# Patient Record
Sex: Female | Born: 1980 | Race: Black or African American | Hispanic: No | Marital: Single | Smoking: Current every day smoker
Health system: Southern US, Community
[De-identification: ages and names within clinical notes are randomized; demographics above are authoritative.]

## PROBLEM LIST (undated history)

## (undated) HISTORY — PX: CYSTECTOMY: SUR359

## (undated) HISTORY — PX: HAND SURGERY: SHX662

## (undated) HISTORY — PX: CHOLECYSTECTOMY: SHX55

## (undated) HISTORY — PX: ANKLE SURGERY: SHX546

---

## 2003-02-02 ENCOUNTER — Other Ambulatory Visit: Admission: RE | Admit: 2003-02-02 | Discharge: 2003-02-02 | Payer: Self-pay | Admitting: Obstetrics and Gynecology

## 2003-03-10 ENCOUNTER — Encounter: Payer: Self-pay | Admitting: Obstetrics and Gynecology

## 2003-03-10 ENCOUNTER — Ambulatory Visit (HOSPITAL_COMMUNITY): Admission: RE | Admit: 2003-03-10 | Discharge: 2003-03-10 | Payer: Self-pay | Admitting: Obstetrics and Gynecology

## 2003-04-11 ENCOUNTER — Ambulatory Visit (HOSPITAL_COMMUNITY): Admission: RE | Admit: 2003-04-11 | Discharge: 2003-04-11 | Payer: Self-pay | Admitting: Obstetrics and Gynecology

## 2003-04-11 ENCOUNTER — Encounter: Payer: Self-pay | Admitting: Obstetrics and Gynecology

## 2003-07-25 ENCOUNTER — Inpatient Hospital Stay (HOSPITAL_COMMUNITY): Admission: AD | Admit: 2003-07-25 | Discharge: 2003-07-25 | Payer: Self-pay | Admitting: Obstetrics and Gynecology

## 2003-07-29 ENCOUNTER — Inpatient Hospital Stay (HOSPITAL_COMMUNITY): Admission: RE | Admit: 2003-07-29 | Discharge: 2003-08-01 | Payer: Self-pay | Admitting: Obstetrics and Gynecology

## 2003-07-29 ENCOUNTER — Encounter (INDEPENDENT_AMBULATORY_CARE_PROVIDER_SITE_OTHER): Payer: Self-pay | Admitting: *Deleted

## 2003-08-09 ENCOUNTER — Inpatient Hospital Stay (HOSPITAL_COMMUNITY): Admission: AD | Admit: 2003-08-09 | Discharge: 2003-08-09 | Payer: Self-pay | Admitting: Obstetrics and Gynecology

## 2003-08-22 ENCOUNTER — Ambulatory Visit (HOSPITAL_COMMUNITY): Admission: RE | Admit: 2003-08-22 | Discharge: 2003-08-24 | Payer: Self-pay | Admitting: General Surgery

## 2003-08-22 ENCOUNTER — Encounter (INDEPENDENT_AMBULATORY_CARE_PROVIDER_SITE_OTHER): Payer: Self-pay | Admitting: *Deleted

## 2004-10-04 ENCOUNTER — Emergency Department (HOSPITAL_COMMUNITY): Admission: EM | Admit: 2004-10-04 | Discharge: 2004-10-04 | Payer: Self-pay | Admitting: Emergency Medicine

## 2007-05-27 ENCOUNTER — Emergency Department (HOSPITAL_COMMUNITY): Admission: EM | Admit: 2007-05-27 | Discharge: 2007-05-28 | Payer: Self-pay | Admitting: Emergency Medicine

## 2007-07-15 ENCOUNTER — Emergency Department (HOSPITAL_COMMUNITY): Admission: EM | Admit: 2007-07-15 | Discharge: 2007-07-15 | Payer: Self-pay | Admitting: Emergency Medicine

## 2007-08-21 ENCOUNTER — Ambulatory Visit: Payer: Self-pay | Admitting: Family Medicine

## 2007-10-08 ENCOUNTER — Ambulatory Visit (HOSPITAL_COMMUNITY): Admission: RE | Admit: 2007-10-08 | Discharge: 2007-10-08 | Payer: Self-pay | Admitting: Family Medicine

## 2009-05-28 ENCOUNTER — Emergency Department: Payer: Self-pay | Admitting: Emergency Medicine

## 2009-06-22 ENCOUNTER — Emergency Department: Payer: Self-pay | Admitting: Unknown Physician Specialty

## 2009-11-21 ENCOUNTER — Emergency Department: Payer: Self-pay | Admitting: Emergency Medicine

## 2010-03-06 ENCOUNTER — Inpatient Hospital Stay: Payer: Self-pay | Admitting: Psychiatry

## 2010-05-16 ENCOUNTER — Emergency Department: Payer: Self-pay | Admitting: Emergency Medicine

## 2010-05-20 ENCOUNTER — Emergency Department: Payer: Self-pay | Admitting: Emergency Medicine

## 2010-05-30 ENCOUNTER — Emergency Department: Payer: Self-pay | Admitting: Emergency Medicine

## 2010-10-06 ENCOUNTER — Encounter: Payer: Self-pay | Admitting: Internal Medicine

## 2011-02-01 NOTE — Discharge Summary (Signed)
NAME:  Emma Hale, Emma Hale                        ACCOUNT NO.:  [id number]   MEDICAL RECORD NO.:  [id number]                   PATIENT TYPE:  INP   LOCATION:  9134                                 FACILITY:  [facility]   PHYSICIAN:  Hal Morales, M.D.             DATE OF BIRTH:  May 02, 1981   DATE OF ADMISSION:  07/29/2003  DATE OF DISCHARGE:  08/01/2003                                 DISCHARGE SUMMARY   ADMITTING DIAGNOSES:  1. Intrauterine pregnancy at term.  2. Desires repeat cesarean section and tubal sterilization.   DISCHARGE DIAGNOSES:  1. Intrauterine pregnancy at term.  2. Desires repeat cesarean section and tubal sterilization.   PROCEDURES:  Repeat low transverse cesarean section with bilateral tubal  ligation.   HOSPITAL COURSE:  [name] is a 30 year old gravida 2 para 1-0-0-1 with  an EDC of August 03, 2003 who was admitted on July 29, 2003 for a  scheduled repeat low transverse cesarean section and tubal sterilization.  Her pregnancy was remarkable for:  1. BV.  2. History of previous cesarean section with the desire for repeat.  3. 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   The patient was taken to the operating room where a repeat low transverse  cesarean section was performed by Dr. Jaymes Graff.  A JP drain was placed.  Findings were a viable female by the name of [name], weight 7 pounds 13 ounces,  Apgars were 9 and 9.  The patient tolerated the procedure well.  Estimated  blood loss was approximately 1000 mL.  She tolerated the procedure well and  was taken to the recovery room in good condition.  Infant was taken to the  full-term nursery in good condition.  By postoperative day #1 the patient  was doing well although she was having some issues with pain control with  oral pain medication.  On postoperative day #1 they did give her some  Dilaudid and Tylox.  Postoperative day #2 she was given morphine and  Phenergan and  then was given Toradol through the night.  JP drain had been  removed that day without difficulty.  Incision was clean, dry, and intact.  She was passing flatus and was tolerating a regular diet.  By the morning of  August 01, 2003, postoperative day #3, the patient was doing well.  She  did have some isolated incisional pain on the left-hand side at the site  where there was a little bit of eversion of the skin edge of the incision.  No infection of infection, cellulitis, or disruption of the staples was  noted.  She was up ad lib, was tolerating a reg diet.  She was on oral pain  medications with Tylox which were providing reasonable pain relief.  She was  deemed to have received the full benefit of her hospital stay and was  discharged home.  She was bottle  feeding her infant.   DISCHARGE INSTRUCTIONS:  Per [facility] handout.   DISCHARGE MEDICATIONS:  1. Motrin 600 mg p.o. q.6h. p.r.n. pain.  2.     Tylox one to two p.o. q.3-4h. p.r.n. pain.  3. Prenatal vitamin one p.o. daily.   DISCHARGE FOLLOW-UP:  Will occur in six weeks at [facility].     [name], C.N.M.                   Hal Morales, M.D.    VLL/MEDQ  D:  08/01/2003  T:  08/01/2003  Job:  [id number]

## 2011-02-01 NOTE — Op Note (Signed)
NAME:  Emma Hale, Emma Hale                        ACCOUNT NO.:  [id number]   MEDICAL RECORD NO.:  [id number]                   PATIENT TYPE:  OIB   LOCATION:  5735                                 FACILITY:  [facility]   PHYSICIAN:  Lina Sar, M.D. [facility]               DATE OF BIRTH:  [birth year]   DATE OF PROCEDURE:  08/23/2003  DATE OF DISCHARGE:                                 OPERATIVE REPORT   PROCEDURE:  Endoscopic retrograde cholangiopancreatography.   GASTROENTEROLOGIST:  Lina Sar, M.D.   INDICATIONS:  This 30 year old African-American female presented with acute  cholecystitis and biliary colic.  Her liver function tests were mildly  elevated.  She underwent laparoscopic cholecystectomy with findings of  mobile opacities in the common bile duct consistent with stones.  The common  bile duct measured about 5 to 6 mm in diameter.  She continued to have  abdominal pain through the night.  She is undergoing ERCP with possible  sphincterotomy and stone extraction.   ENDOSCOPE:  Fujinon single-channel side-viewing duodenoscope.   SEDATION:  Versed 10 mg, IV fentanyl 100 mcg IV, Glucagon 1.5 mg IV.   FINDINGS:  Fujinon single-channel side-viewing duodenoscope passed blindly  through the esophagus into the stomach and through the pyloric channel into  the duodenum.  Papilla appeared normal with small periampullary  diverticulum.  It was cannulated with visualization of the common bile duct  without difficulty.  The common bile duct measured about 6 mm in diabetes,  and there was no clear stone visualized in the common bile duct.  Guidewire  was placed into the common bile duct, and standard sphincterotomy was  carried out.  Through the sphincterotomy, an 8 mm Montez Morita balloon was  placed into the common bile duct.  Under fluoroscopic guidance, bile duct  was swept twice.  Occlusion cholangiogram was obtained  after sweeping the  common bile duct.  No extraction particles, or stones  were visualized  draining from the  common bile duct.  Post ERCP spot films were obtained of  the comon bile duct.  The patient tolerated the procedure well.   IMPRESSION:  1. Status post laparoscopic cholecystectomy.  2. Upper limits of normal common bile duct status post endoscopic     sphincterotomy.  3. No extraction stone are seen status post sweep of the bile duct with 8 mm     balloon x 2.   PLAN:  The patient will be observed today.  We will repeat her LFTs and  amylase and lipase in the morning.  We will advance her diet.                                               Lina Sar, M.D. [facility]    DB/MEDQ  D:  08/23/2003  T:  08/23/2003  Job:  [id number]   cc:   Leonie Man, M.D.  [address]  [place]  [place] [address]  Fax: [phone]

## 2011-02-01 NOTE — Op Note (Signed)
NAME:  Emma Hale, Emma Hale                        ACCOUNT NO.:  [id number]   MEDICAL RECORD NO.:  [id number]                   PATIENT TYPE:  OIB   LOCATION:  5735                                 FACILITY:  [facility]   PHYSICIAN:  Leonie Man, M.D.                DATE OF BIRTH:  1981/04/24   DATE OF PROCEDURE:  08/22/2003  DATE OF DISCHARGE:                                 OPERATIVE REPORT   PREOPERATIVE DIAGNOSIS:  Chronic calculus cholecystitis.   POSTOPERATIVE DIAGNOSIS:  Chronic  calculus cholecystitis with  choledocholithiasis.   PROCEDURE:  Laparoscopic cholecystectomy, and intraoperative cholangiogram.   Dictation ends here.                                               Leonie Man, M.D.    PB/MEDQ  D:  08/22/2003  T:  08/23/2003  Job:  [id number]

## 2011-02-01 NOTE — Op Note (Signed)
NAME:  Emma Hale, Emma Hale                        ACCOUNT NO.:  [id number]   MEDICAL RECORD NO.:  [id number]                   PATIENT TYPE:  INP   LOCATION:  9134                                 [facility]ACILITY[facility]   PHYSICIAN:  Naima A. Dillard, M.D.              DATE OF BIRTH:  [birth year]   DATE OF PROCEDURE:  07/29/2003  DATE OF DISCHARGE:                                 OPERATIVE REPORT   PREOPERATIVE DIAGNOSIS:  Intrauterine pregnancy at term, desires repeat  cesarean section and tubal ligation.   POSTOPERATIVE DIAGNOSIS:  Intrauterine pregnancy at term, desires repeat  cesarean section and tubal ligation.   PROCEDURE:  1. Repeat cesarean section.   SURGEON:  Naima A. Normand Sloop, M.D.   ASSISTANT:  Renaldo Reel. Emilee Hero, C.N.M.   ANESTHESIA:  Epidural.   ESTIMATED BLOOD LOSS:  1000 mL.   URINE OUTPUT:  200 mL.   FLUIDS REPLACED:  3200 mL of crystalloid.   FINDINGS:  A female infant in vertex position, given the name [name].  There  was clear fluid, Apgars were 9 and 9, weight was 7 pounds 13 ounces.  Normal-  appearing abdominal anatomy, uterus, tubes, and ovaries.  There were no  complications.  The patient went to the recovery room in stable condition.  Before the procedure took place, both in the holding area and at the office  we talked about the risk of vaginal birth after cesarean section versus  repeat cesarean section, and we also talked about all birth control,  including tubal ligation.  The patient understood her risks to be, but not  limited to, risk from anesthesia, bleeding, infection, damage to internal  organs such as bowel and bladder, major blood vessels, failure rate of the  tubal ligation about one in 200, and risk of ectopic pregnancy.  The patient  still decided to proceed with the procedure noted above.   PROCEDURE IN DETAIL:  The patient was taken to the operating room, and she  was given epidural anesthesia, placed in dorsal supine position with a left  lateral tilt, and prepped and draped in a normal sterile fashion.  A Foley  catheter was placed.  A Pfannenstiel skin incision was then made with a  scalpel along her previous incision and carried down to the fascia.  The  fascia was then incised in the midline and extended bilaterally using Bovie  cautery and Mayos and pickups with teeth.  Kochers x2 were placed on the  superior aspect of the fascia, which was dissected off the rectus muscle  both superiorly and inferiorly without difficulty.  The peritoneum was  already actually open.  There was some omentum protruding.  The rectus  muscle was then separated in the midline both sharply and bluntly, and the  bladder blade was inserted.  The vesicouterine peritoneum was identified,  tented up, and entered sharply and extended bilaterally.  The bladder blade  was then  reinserted.  A lower transverse uterine incision was then made with  the scalpel and extended bilaterally with bandage scissors.  The infant was  delivered without difficulty.  Mouth and nares were then bulb-suctioned.  There was no nuchal cord, clear fluid, no meconium.  The body was delivered  without difficulty.  The cord was clamped and cut.  Cord blood was obtained.  The placenta was delivered without difficulty.  The uterus was cleared of  all clot and debris.  The uterine incision was repaired with 0 Vicryl in a  running locked fashion.  The patient's left fallopian tube was then  identified, grasped with a Babcock clamp, and about a 1.5 cm mid-isthmic  portion of the tube was ligated with 2-0 plain and excised.  Hemostasis was  assured.  The patient's ovary was within normal limits.  Attention was then  turned to the patient's right fallopian tube, which was grasped with Babcock  clamps, followed out to the fimbriated end, and again about a center of the  mid-isthmic portion of the tube was ligated with 2-0 plain and excised.  Hemostasis was assured.  Attention was then  turned back to the uterine  incision.  Irrigation was done and the uterine incision was noted to be dry.  The peritoneum was closed with 0 chromic.  The fascia was closed with 0  Vicryl in a running fashion, and a JP drain was placed in the subcutaneous  tissue after any bleeders were made hemostatic with Bovie cautery and after  irrigation.  The subcutaneous tissue was reapproximated with 2-0 plain, skin  was closed with staples.  Sponge, lap, and needle counts were correct x2.  The patient went to the recovery room in stable condition.                                               Naima A. Normand Sloop, M.D.    NAD/MEDQ  D:  07/29/2003  T:  07/29/2003  Job:  [id number]

## 2011-02-01 NOTE — Op Note (Signed)
NAME:  Emma Hale, Emma Hale                        ACCOUNT NO.:  [id number]   MEDICAL RECORD NO.:  [id number]                   PATIENT TYPE:  OIB   LOCATION:  5735                                 FACILITY:  [facility]   PHYSICIAN:  Leonie Man, M.D.                DATE OF BIRTH:  1981/05/31   DATE OF PROCEDURE:  08/22/2003  DATE OF DISCHARGE:                                 OPERATIVE REPORT   PREOPERATIVE DIAGNOSIS:  Subacute cholecystitis, cholelithiasis.   POSTOPERATIVE DIAGNOSIS:  Chronic calculus cholecystitis with  choledocholithiasis.   PROCEDURE:  Laparoscopic cholecystectomy with intraoperative cholangiogram.   SURGEON:  Leonie Man, M.D.   ASSISTANT:  Joanne Gavel, M.D.   ANESTHESIA:  General.   INDICATIONS FOR PROCEDURE:  This patient is a 30 year old recently  postpartum woman who presented with increasing  abdominal pain and nausea.  She was first evaluated at the [facility]  where on ultrasound she  was noted to have cholelithiasis. At the time of evaluation her liver  function studies were normal. She had no leukocytosis or symptoms of  jaundice, chills or fever. In the interim since she has been scheduled for  cholecystectomy, prior to her coming to the operating room her liver  function studies have elevated significantly, showing elevations in the ALT,  AST and alkaline phosphatase. She has no jaundice and bilirubin is within  normal limits. She comes to the operating room after the risks and benefits  of potential surgery have been fully discussed and all questions answered.   DESCRIPTION OF PROCEDURE:  Following  the induction of satisfactory general  anesthesia the patient was positioned supinely. The abdomen is prepped and  draped to be included in the sterile operative field. An open laparoscopy  was created at the umbilicus with insertion of a Hassan type cannula and  insufflation of the peritoneal cavity to 14 mmHg pressure using CO2.   The camera  was inserted and visual exploration of the abdomen was carried  out. The gallbladder was shown to be chronically scarred with multiple  adhesions to the wall of the gallbladder. The liver edges were somewhat  rounded but otherwise  within normal limits. The liver surfaces were smooth.  The anterior gastric wall appeared  to be normal. The duodenum and duodenal  sweep were normal except there were multiple  adhesions from the duodenum up  to the entry of the gallbladder. None of the small  or large intestine  appeared  to be abnormal. The pelvic organs appeared  to be normal. The  uterus was approximately 3 months size. The ovaries appeared to be normal.   Under direct vision, epigastric  and lateral  ports were placed. The  gallbladder was grasped and retracted cephalad. Dissection was carried down  to the ampulla, removing adhesions to the gallbladder wall. The cystic duct  and cystic artery were then serially isolated, tracing the cystic duct up to  the cystic duct gallbladder  junction  and down to the cystic duct common  junction and tracing the cystic artery up to its entry into the gallbladder  wall. The cystic artery was then isolated, doubly clipped and transected.  The cystic duct was clipped proximally and opened.   The cystic duct cholangiogram  was carried out with a Cook catheter which  was passed through the abdominal wall. The catheter was placed in the cystic  duct and a fluoroscopically controlled cholangiogram  was carried out using  1/2 strength Hypaque. The resulting cholangiogram  shows free flow of  contrast into the common bile duct hepatic ducts with normal appearing upper  radicals. There were several filling defects within the common bile duct and  there was a meniscus at the distal common bile duct consistent with the  presence of a stone.   The cystic duct catheter was then withdrawn. The cystic duct was the triply  clipped and then  transected. The  gallbladder  was dissected free from the  liver bed using electrocautery and maintaining hemostasis throughout the  course of the dissection. At the end of the dissection, the right upper  quadrant was thoroughly irrigated with normal saline and aspirated. All  areas of dissection were checked for hemostasis and noted to be dry. The  gallbladder  was placed in an Endopouch and retrieved through the umbilical  port without difficulty.   Sponge, instrument and sharp counts were then verified. The wounds were then  closed in layers after the pneumoperitoneum had been completely evacuated.  The umbilical wound was closed in 2 layers with 0 Dexon and 4-0 Dexon.  Epigastric  and right flank wounds were closed with 4-0 Monocryl sutures.  These were then reinforced with Steri-Strips and sterile dressings were  applied.   The anesthetic was then reversed. The patient was removed from the operating  room to the recovery room in stable condition. She tolerated the procedure  well.                                               Leonie Man, M.D.    PB/MEDQ  D:  08/22/2003  T:  08/23/2003  Job:  [id number]   cc:   Naima A. Normand Sloop, M.D.  [address]  [place]  [place] [address]  Fax: [phone]

## 2011-02-01 NOTE — H&P (Signed)
NAME:  Emma Hale, Emma Hale                        ACCOUNT NO.:  [id number]   MEDICAL RECORD NO.:  [id number]                   PATIENT TYPE:  INP   LOCATION:  NA                                   [facility]   PHYSICIAN:  Naima A. Dillard, M.D.              DATE OF BIRTH:  02-Mar-1981   DATE OF ADMISSION:  07/28/2003  DATE OF DISCHARGE:                                HISTORY & PHYSICAL   ADMISSION DIAGNOSES:  1. 39 week pregnancy.  2. Desires repeat cesarean section and tubal ligation.   HISTORY OF PRESENT ILLNESS:  The patient is a 30 year old African-American  female, gravida 2, para 1-0-0-1, whose last menstrual period is October 26, 2002, consistent with a second trimester ultrasound which gives her a due  date of August 03, 2003.  The patient is presenting for a repeat cesarean  section and tubal ligation.  The patient is currently without complaints.  Her prenatal care started at [facility] at 14 weeks.  Her pregnancy  has been complicated by:  1) Bacterial vaginosis which was treated with  Metronidazole.  2) She has a history of a previous cesarean section.  She  was counseled for a VBAC versus a repeat cesarean section.  She understands  the risks of both and has decided to go with a cesarean section.  3) She was  diagnosed with herpes simplex virus type 2 in October of this year. She was  given Valtrex, but declined any Valtrex suppression and has had no further  outbreaks.  4) She had an abnormal one-hour Glucola at 145, her three-hour  Glucola was normal.   PRENATAL LABORATORY DATA:  Prenatal hemoglobin was 12, platelets were 302.  She is B positive, antibody negative, RPR is nonreactive. Rubella immune.  Hepatitis B surface antigen was negative.  Human immunodeficiency virus was  nonreactive.  GC and Chlamydia were negative.  Pap test was within normal  limits.  Group B Strep was within normal limits.  Quad screen was within  normal limits.  Group B Strep  was negative.   PAST OBSTETRICAL HISTORY:  Significant for a full term cesarean section in  November of 2002.  The baby weighed 8 pounds 2 ounces.  She had a cesarean  section for failure to progress of a baby girl without any complications.   PAST GYN HISTORY:  Significant for menarche at age 35, occurring every 28  days, lasting for four to five days.  She has a history of gonorrhea treated  in 1996.  History of Trichomonas treated in 2002.  History of HSV just  diagnosed.  The patient used Depo-Provera until May of 2003.  She reports a  history of abnormal Pap, being Trichomonas, and has had no colposcopy or  follow-up.   PAST MEDICAL HISTORY:  As above.   PAST SURGICAL HISTORY:  Significant for cesarean section x1.  The patient  has  no known drug allergies.   SOCIAL HISTORY:  She is a single female who lives with her mother who is  disabled. The father of the baby is not involved.  The patient did smoke  cigarettes until she knew she was pregnant and stopped tobacco use. She  denies any illicit drug use or alcohol use.   FAMILY HISTORY:  Significant for maternal grandmother and sister with  chronic hypertension.  Paternal grandmother with varicosities.  Sister with  anemia and a sister, brother, and nephew with asthma.  Her paternal  grandmother has insulin-dependent diabetes mellitus.  Genetic history; the  father of the baby is 32 years old and there are no genetic issues.   PHYSICAL EXAMINATION:  VITAL SIGNS:  The patient weighs 293 pounds.  Fetal  heart tones 150, fundal height 39 cm. Urine protein was negative.  Blood  pressure 100/70.  GENERAL:  The patient is pregnant and in no acute distress.  HEENT:  Head is normocephalic and atraumatic.  Mouth and teeth were normal.  NECK:  Free range of motion.  Thyroid was not enlarged and nontender.  SKIN:  Normal.  NEUROLOGY:  Within normal limits.  EXTREMITIES:  No cyanosis, clubbing, or edema.  BREASTS:  Within normal  limits.  HEART:  Regular rate and rhythm.  LUNGS:  Clear to auscultation bilaterally.  ABDOMEN:  Gravid, soft, and nontender with a well-healed cesarean section  scar.  Her gastrointestinal examination is within normal limits.  Well  vaginal examination is within normal limits.  Cervix was closed and  nontender.  Uterine fundal height was 39 cm.   ASSESSMENT:  Pregnancy at 39-2/7 weeks, desires repeat cesarean section and  tubal ligation.  The patient understands the risks are, but not limited to  anesthesia risks, bleeding, infection, damage to internal organs such as  bowel and bladder.  Failure rate of about 1 in 200 to 1 in 300 for tubal  ligation and that it may result in ectopic pregnancy.                                               Naima A. Normand Sloop, M.D.    NAD/MEDQ  D:  07/28/2003  T:  07/28/2003  Job:  [id number]

## 2011-02-18 ENCOUNTER — Emergency Department (INDEPENDENT_AMBULATORY_CARE_PROVIDER_SITE_OTHER): Payer: No Typology Code available for payment source

## 2011-02-18 ENCOUNTER — Emergency Department (HOSPITAL_BASED_OUTPATIENT_CLINIC_OR_DEPARTMENT_OTHER)
Admission: EM | Admit: 2011-02-18 | Discharge: 2011-02-18 | Disposition: A | Discharge status: Home / Self Care | Payer: Self-pay | Attending: Emergency Medicine | Admitting: Emergency Medicine

## 2011-02-18 DIAGNOSIS — M25579 Pain in unspecified ankle and joints of unspecified foot: Secondary | ICD-10-CM | POA: Insufficient documentation

## 2011-02-18 DIAGNOSIS — S82853A Displaced trimalleolar fracture of unspecified lower leg, initial encounter for closed fracture: Secondary | ICD-10-CM

## 2011-02-18 DIAGNOSIS — G8929 Other chronic pain: Secondary | ICD-10-CM | POA: Insufficient documentation

## 2011-02-18 DIAGNOSIS — W19XXXA Unspecified fall, initial encounter: Secondary | ICD-10-CM

## 2011-02-18 DIAGNOSIS — M79609 Pain in unspecified limb: Secondary | ICD-10-CM

## 2011-02-18 DIAGNOSIS — Z79899 Other long term (current) drug therapy: Secondary | ICD-10-CM | POA: Insufficient documentation

## 2011-03-15 ENCOUNTER — Emergency Department (HOSPITAL_COMMUNITY): Payer: No Typology Code available for payment source

## 2011-03-15 ENCOUNTER — Emergency Department (HOSPITAL_COMMUNITY)
Admission: EM | Admit: 2011-03-15 | Discharge: 2011-03-15 | Disposition: A | Discharge status: Home / Self Care | Payer: No Typology Code available for payment source | Attending: Emergency Medicine | Admitting: Emergency Medicine

## 2011-03-15 DIAGNOSIS — F341 Dysthymic disorder: Secondary | ICD-10-CM | POA: Insufficient documentation

## 2011-03-15 DIAGNOSIS — M25519 Pain in unspecified shoulder: Secondary | ICD-10-CM | POA: Insufficient documentation

## 2011-03-15 DIAGNOSIS — M549 Dorsalgia, unspecified: Secondary | ICD-10-CM | POA: Insufficient documentation

## 2011-03-20 ENCOUNTER — Emergency Department (HOSPITAL_COMMUNITY)
Admission: EM | Admit: 2011-03-20 | Discharge: 2011-03-20 | Disposition: A | Discharge status: Home / Self Care | Payer: No Typology Code available for payment source | Attending: Emergency Medicine | Admitting: Emergency Medicine

## 2011-03-20 DIAGNOSIS — M549 Dorsalgia, unspecified: Secondary | ICD-10-CM | POA: Insufficient documentation

## 2011-03-20 DIAGNOSIS — M25569 Pain in unspecified knee: Secondary | ICD-10-CM | POA: Insufficient documentation

## 2011-04-02 ENCOUNTER — Ambulatory Visit (INDEPENDENT_AMBULATORY_CARE_PROVIDER_SITE_OTHER): Payer: No Typology Code available for payment source

## 2011-04-02 ENCOUNTER — Inpatient Hospital Stay (INDEPENDENT_AMBULATORY_CARE_PROVIDER_SITE_OTHER)
Admission: RE | Admit: 2011-04-02 | Discharge: 2011-04-02 | Disposition: A | Discharge status: Home / Self Care | Payer: No Typology Code available for payment source | Source: Ambulatory Visit | Attending: Emergency Medicine | Admitting: Emergency Medicine

## 2011-04-02 DIAGNOSIS — S82899A Other fracture of unspecified lower leg, initial encounter for closed fracture: Secondary | ICD-10-CM

## 2011-04-19 ENCOUNTER — Emergency Department (HOSPITAL_COMMUNITY)
Admission: EM | Admit: 2011-04-19 | Discharge: 2011-04-20 | Disposition: A | Discharge status: Home / Self Care | Payer: Medicaid Other | Attending: Emergency Medicine | Admitting: Emergency Medicine

## 2011-04-19 DIAGNOSIS — X58XXXA Exposure to other specified factors, initial encounter: Secondary | ICD-10-CM | POA: Insufficient documentation

## 2011-04-19 DIAGNOSIS — F4310 Post-traumatic stress disorder, unspecified: Secondary | ICD-10-CM | POA: Insufficient documentation

## 2011-04-19 DIAGNOSIS — K649 Unspecified hemorrhoids: Secondary | ICD-10-CM | POA: Insufficient documentation

## 2011-04-19 DIAGNOSIS — S335XXA Sprain of ligaments of lumbar spine, initial encounter: Secondary | ICD-10-CM | POA: Insufficient documentation

## 2011-04-19 DIAGNOSIS — M545 Low back pain, unspecified: Secondary | ICD-10-CM | POA: Insufficient documentation

## 2011-04-19 DIAGNOSIS — F341 Dysthymic disorder: Secondary | ICD-10-CM | POA: Insufficient documentation

## 2011-04-19 DIAGNOSIS — K625 Hemorrhage of anus and rectum: Secondary | ICD-10-CM | POA: Insufficient documentation

## 2011-04-19 LAB — COMPREHENSIVE METABOLIC PANEL
ALT: 53 U/L — ABNORMAL HIGH (ref 0–35)
AST: 25 U/L (ref 0–37)
Albumin: 4.1 g/dL (ref 3.5–5.2)
Alkaline Phosphatase: 99 U/L (ref 39–117)
BUN: 9 mg/dL (ref 6–23)
CO2: 26 mEq/L (ref 19–32)
Calcium: 9.8 mg/dL (ref 8.4–10.5)
Chloride: 102 mEq/L (ref 96–112)
Creatinine, Ser: 0.64 mg/dL (ref 0.50–1.10)
GFR calc Af Amer: >60 mL/min (ref >60)
GFR calc non Af Amer: >60 mL/min (ref >60)
Glucose, Bld: 95 mg/dL (ref 70–99)
Potassium: 4.1 mEq/L (ref 3.5–5.1)
Sodium: 136 mEq/L (ref 135–145)
Total Bilirubin: 0.3 mg/dL (ref 0.3–1.2)
Total Protein: 7.5 g/dL (ref 6.0–8.3)

## 2011-04-19 LAB — CBC
HCT: 37 % (ref 36.0–46.0)
Hemoglobin: 12.6 g/dL (ref 12.0–15.0)
MCH: 27.8 pg (ref 26.0–34.0)
MCHC: 34.1 g/dL (ref 30.0–36.0)
MCV: 81.7 fL (ref 78.0–100.0)
Platelets: 263 10*3/uL (ref 150–400)
RBC: 4.53 MIL/uL (ref 3.87–5.11)
RDW: 12 % (ref 11.5–15.5)
WBC: 5.6 10*3/uL (ref 4.0–10.5)

## 2011-04-19 LAB — DIFFERENTIAL
Basophils Absolute: 0 10*3/uL (ref 0.0–0.1)
Basophils Relative: 1 % (ref 0–1)
Eosinophils Absolute: 0.3 10*3/uL (ref 0.0–0.7)
Eosinophils Relative: 5 % (ref 0–5)
Lymphocytes Relative: 50 % — ABNORMAL HIGH (ref 12–46)
Lymphs Abs: 2.8 10*3/uL (ref 0.7–4.0)
Monocytes Absolute: 0.5 10*3/uL (ref 0.1–1.0)
Monocytes Relative: 8 % (ref 3–12)
Neutro Abs: 2.1 10*3/uL (ref 1.7–7.7)
Neutrophils Relative %: 37 % — ABNORMAL LOW (ref 43–77)

## 2011-04-19 LAB — URINALYSIS, ROUTINE W REFLEX MICROSCOPIC
Bilirubin Urine: NEGATIVE
Glucose, UA: NEGATIVE mg/dL
Hgb urine dipstick: NEGATIVE
Ketones, ur: NEGATIVE mg/dL
Leukocytes, UA: NEGATIVE
Nitrite: NEGATIVE
Protein, ur: NEGATIVE mg/dL
Specific Gravity, Urine: 1.012 (ref 1.005–1.030)
Urobilinogen, UA: 0.2 mg/dL (ref 0.0–1.0)
pH: 6 (ref 5.0–8.0)

## 2011-04-20 ENCOUNTER — Emergency Department (HOSPITAL_COMMUNITY): Payer: Medicaid Other

## 2011-04-20 LAB — OCCULT BLOOD, POC DEVICE: Fecal Occult Bld: POSITIVE

## 2011-04-25 ENCOUNTER — Other Ambulatory Visit: Payer: Self-pay | Admitting: Family Medicine

## 2011-04-25 ENCOUNTER — Ambulatory Visit
Admission: RE | Admit: 2011-04-25 | Discharge: 2011-04-25 | Disposition: A | Discharge status: Home / Self Care | Payer: No Typology Code available for payment source | Source: Ambulatory Visit | Attending: Family Medicine | Admitting: Family Medicine

## 2011-04-25 DIAGNOSIS — Z0000 Encounter for general adult medical examination without abnormal findings: Secondary | ICD-10-CM

## 2011-06-28 LAB — URINALYSIS, ROUTINE W REFLEX MICROSCOPIC
Bilirubin Urine: NEGATIVE
Glucose, UA: NEGATIVE
Ketones, ur: NEGATIVE
Leukocytes, UA: NEGATIVE
Nitrite: NEGATIVE
Protein, ur: NEGATIVE
Specific Gravity, Urine: 1.018
Urobilinogen, UA: 0.2
pH: 7.5

## 2011-06-28 LAB — PREGNANCY, URINE: Preg Test, Ur: NEGATIVE

## 2011-09-17 IMAGING — CR DG ANKLE COMPLETE 3+V*R*
3 series · 3 of 3 positions shown · non-contrast
Comparison: 02/18/2011

CLINICAL DATA: Ankle pain.  Remote history of fracture.

RIGHT ANKLE - COMPLETE 3+ VIEW

[view not recorded (1 of 3)]
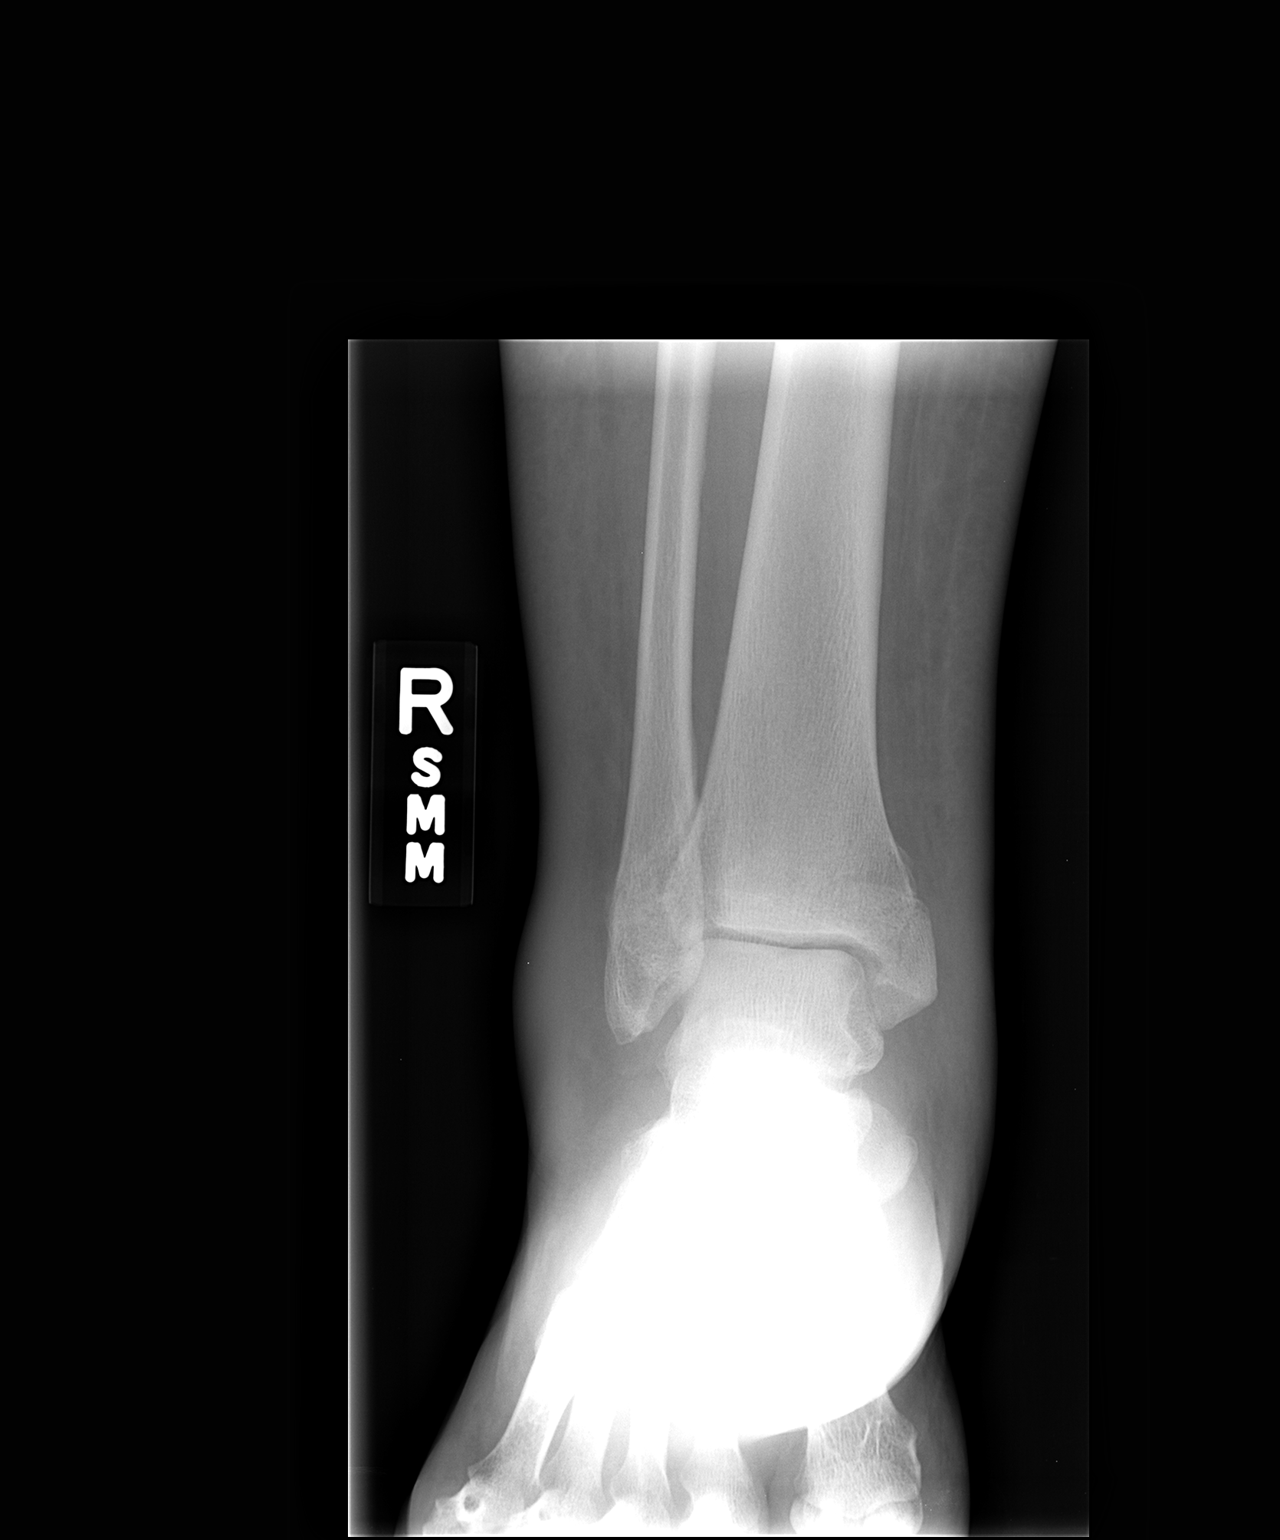

[view not recorded (2 of 3)]
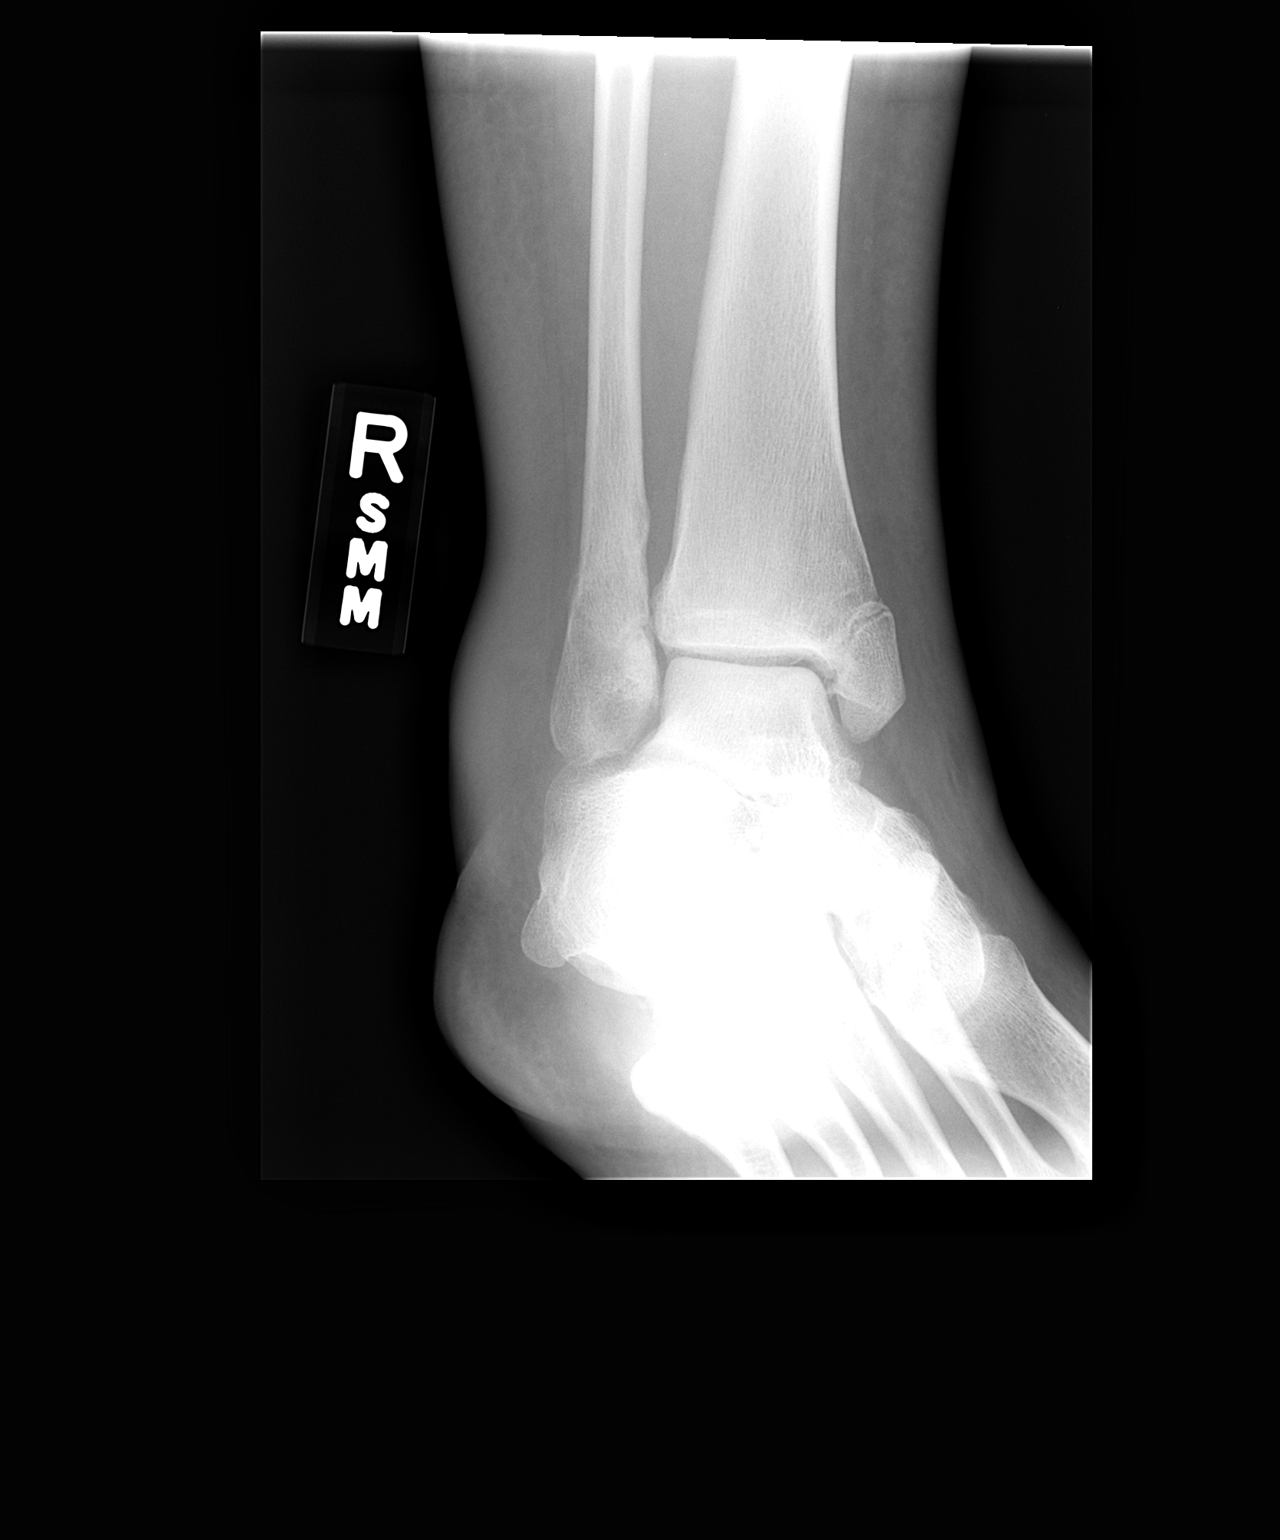

[view not recorded (3 of 3)]
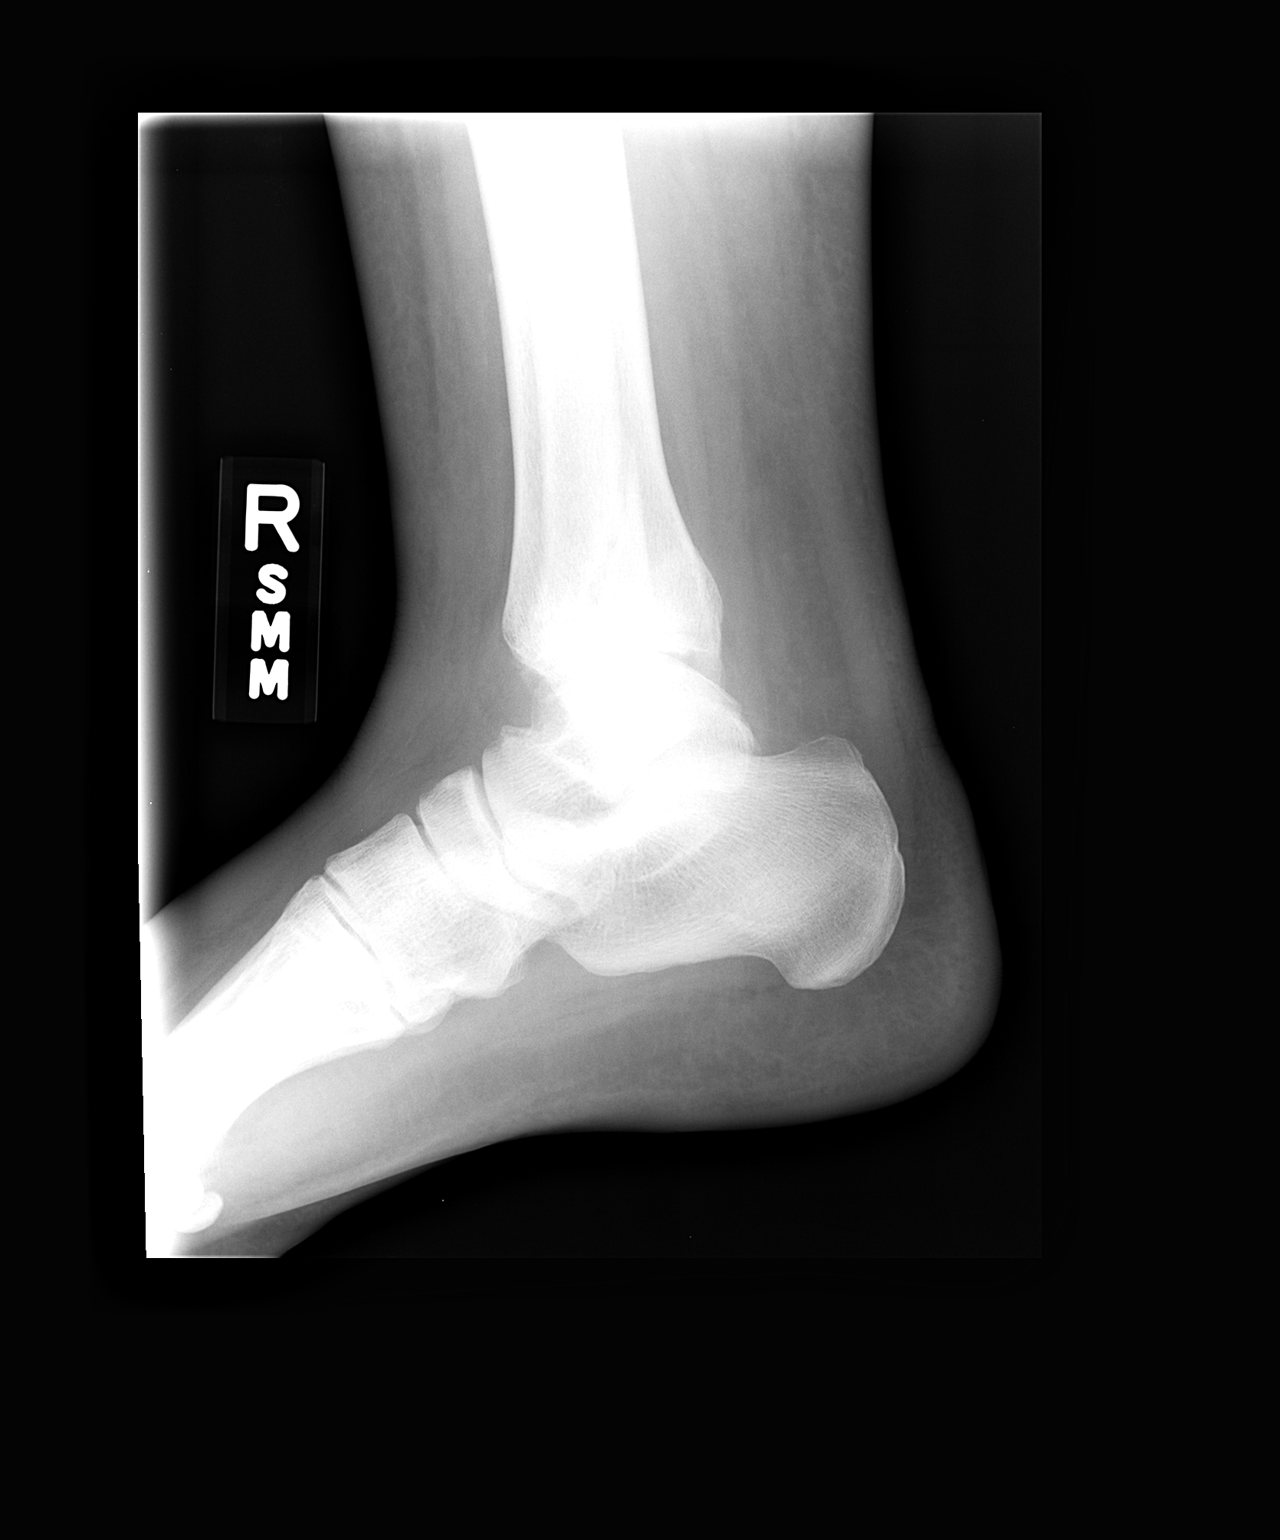

[3 of 3 positions shown; findings below may reference images not displayed]

FINDINGS: Again noted are of the healing trimalleolar fractures.
Fracture lines remain evident.  Diffuse soft tissue swelling.  No
new bony abnormality.
IMPRESSION: Healing trimalleolar fracture.  Fracture lines remain evident.

## 2011-10-10 IMAGING — CR DG CHEST 2V
2 series · 2 of 2 positions shown · non-contrast
Comparison: [HOSPITAL] chest x-ray 10/04/2004, thoracic
spine radiographs 10/08/2007 and 03/15/2011.

CLINICAL DATA: Routine general medical exam at health care
facility, pain, smoker.

CHEST - 2 VIEW

[w chest pa]
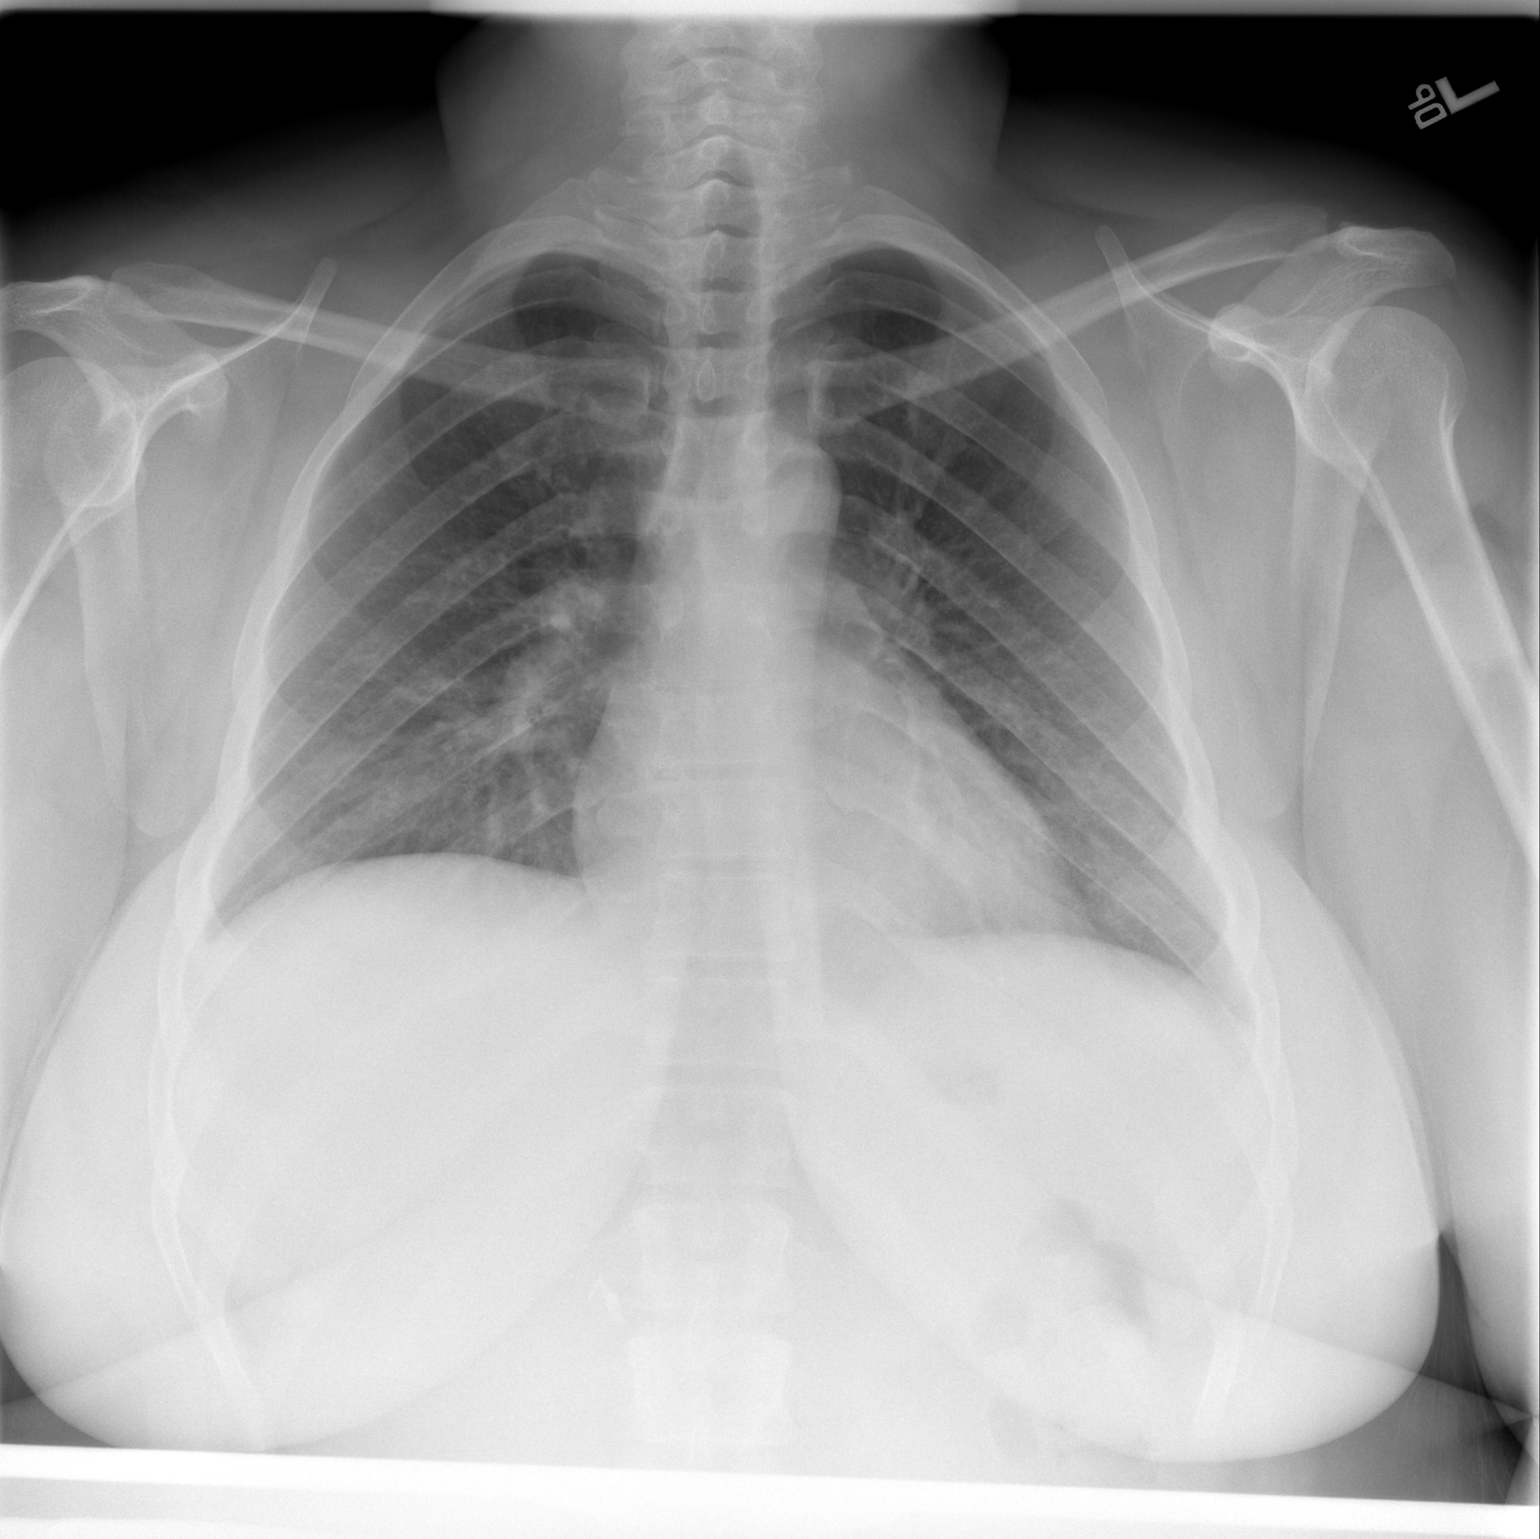

[w chest lat]
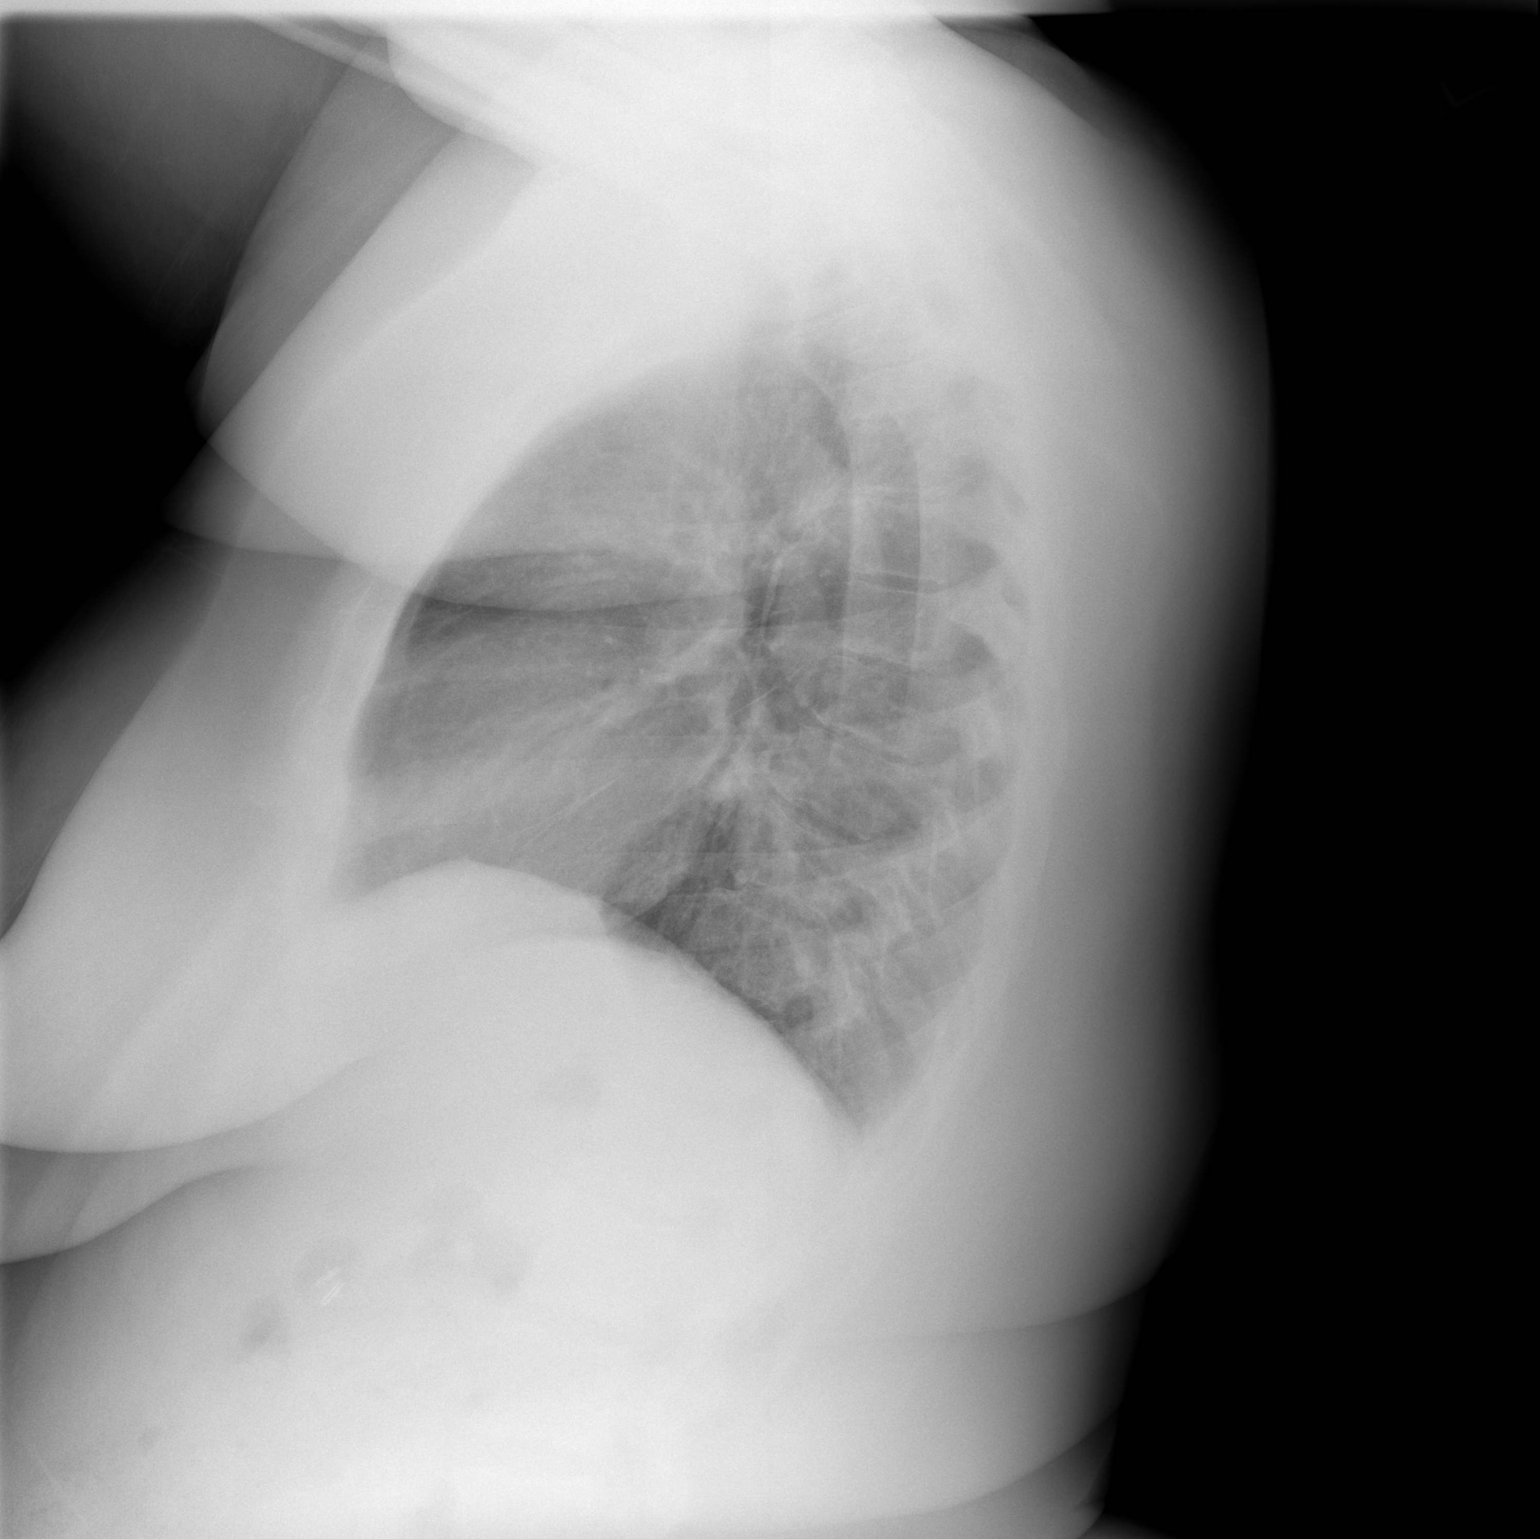

[2 of 2 positions shown; findings below may reference images not displayed]

FINDINGS: Lower lung volumes with stable slight central airway
thickening of chronic asthma or bronchitis.

Lungs otherwise clear.

Heart size normal.

Mediastinum, hila, pleura and osseous structures stable and
unremarkable.

Right upper quadrant cholecystectomy surgical clips.
IMPRESSION: 1.  Since 10/04/2004 stable slight central airway thickening of
chronic asthma or bronchitis.
2.  No acute findings.

## 2016-09-27 ENCOUNTER — Emergency Department (HOSPITAL_COMMUNITY): Payer: Medicare Other

## 2016-09-27 ENCOUNTER — Encounter (HOSPITAL_COMMUNITY): Payer: Self-pay

## 2016-09-27 DIAGNOSIS — Y939 Activity, unspecified: Secondary | ICD-10-CM | POA: Diagnosis not present

## 2016-09-27 DIAGNOSIS — Z5321 Procedure and treatment not carried out due to patient leaving prior to being seen by health care provider: Secondary | ICD-10-CM | POA: Diagnosis not present

## 2016-09-27 DIAGNOSIS — Y999 Unspecified external cause status: Secondary | ICD-10-CM | POA: Diagnosis not present

## 2016-09-27 DIAGNOSIS — M25511 Pain in right shoulder: Secondary | ICD-10-CM | POA: Insufficient documentation

## 2016-09-27 DIAGNOSIS — Y92410 Unspecified street and highway as the place of occurrence of the external cause: Secondary | ICD-10-CM | POA: Diagnosis not present
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
## 2016-09-28 ENCOUNTER — Emergency Department (HOSPITAL_COMMUNITY)
Admission: EM | Admit: 2016-09-28 | Discharge: 2016-09-28 | Disposition: A | Discharge status: Home / Self Care | Payer: Medicare Other | Attending: Emergency Medicine | Admitting: Emergency Medicine

## 2016-09-28 NOTE — ED Notes (Signed)
Pt states she will probably go to [place] to be seen and is with her son in peds.
